# Patient Record
Sex: Female | Born: 1960 | Race: White | Hispanic: No | Smoking: Former smoker
Health system: Southern US, Community
[De-identification: ages and names within clinical notes are randomized; demographics above are authoritative.]

## PROBLEM LIST (undated history)

## (undated) DIAGNOSIS — F32A Depression, unspecified: Secondary | ICD-10-CM

## (undated) DIAGNOSIS — E119 Type 2 diabetes mellitus without complications: Secondary | ICD-10-CM

## (undated) DIAGNOSIS — I10 Essential (primary) hypertension: Secondary | ICD-10-CM

## (undated) DIAGNOSIS — J45909 Unspecified asthma, uncomplicated: Secondary | ICD-10-CM

## (undated) DIAGNOSIS — F329 Major depressive disorder, single episode, unspecified: Secondary | ICD-10-CM

## (undated) DIAGNOSIS — M797 Fibromyalgia: Secondary | ICD-10-CM

## (undated) DIAGNOSIS — M549 Dorsalgia, unspecified: Secondary | ICD-10-CM

## (undated) HISTORY — PX: STOMACH SURGERY: SHX791

---

## 2018-10-16 ENCOUNTER — Other Ambulatory Visit: Payer: Self-pay

## 2018-10-16 ENCOUNTER — Encounter (HOSPITAL_COMMUNITY): Payer: Self-pay

## 2018-10-16 ENCOUNTER — Emergency Department (HOSPITAL_COMMUNITY): Payer: 59

## 2018-10-16 ENCOUNTER — Emergency Department (HOSPITAL_COMMUNITY)
Admission: EM | Admit: 2018-10-16 | Discharge: 2018-10-16 | Disposition: A | Discharge status: Home / Self Care | Payer: 59 | Attending: Emergency Medicine | Admitting: Emergency Medicine

## 2018-10-16 DIAGNOSIS — R2242 Localized swelling, mass and lump, left lower limb: Secondary | ICD-10-CM | POA: Diagnosis not present

## 2018-10-16 DIAGNOSIS — I10 Essential (primary) hypertension: Secondary | ICD-10-CM | POA: Insufficient documentation

## 2018-10-16 DIAGNOSIS — E119 Type 2 diabetes mellitus without complications: Secondary | ICD-10-CM | POA: Insufficient documentation

## 2018-10-16 DIAGNOSIS — W19XXXA Unspecified fall, initial encounter: Secondary | ICD-10-CM | POA: Insufficient documentation

## 2018-10-16 DIAGNOSIS — Z87891 Personal history of nicotine dependence: Secondary | ICD-10-CM | POA: Insufficient documentation

## 2018-10-16 DIAGNOSIS — Y92019 Unspecified place in single-family (private) house as the place of occurrence of the external cause: Secondary | ICD-10-CM | POA: Diagnosis not present

## 2018-10-16 DIAGNOSIS — J45909 Unspecified asthma, uncomplicated: Secondary | ICD-10-CM | POA: Insufficient documentation

## 2018-10-16 DIAGNOSIS — Y998 Other external cause status: Secondary | ICD-10-CM | POA: Insufficient documentation

## 2018-10-16 DIAGNOSIS — M79672 Pain in left foot: Secondary | ICD-10-CM | POA: Insufficient documentation

## 2018-10-16 DIAGNOSIS — Y9389 Activity, other specified: Secondary | ICD-10-CM | POA: Diagnosis not present

## 2018-10-16 HISTORY — DX: Depression, unspecified: F32.A

## 2018-10-16 HISTORY — DX: Essential (primary) hypertension: I10

## 2018-10-16 HISTORY — DX: Major depressive disorder, single episode, unspecified: F32.9

## 2018-10-16 HISTORY — DX: Unspecified asthma, uncomplicated: J45.909

## 2018-10-16 HISTORY — DX: Fibromyalgia: M79.7

## 2018-10-16 HISTORY — DX: Type 2 diabetes mellitus without complications: E11.9

## 2018-10-16 HISTORY — DX: Dorsalgia, unspecified: M54.9

## 2018-10-16 NOTE — ED Provider Notes (Signed)
[facility]-EMERGENCY DEPT Provider Note   CSN: [id number] Arrival date & time: 10/16/18  1243    History   Chief Complaint Chief Complaint  Patient presents with  . Foot Pain  . Foot Swelling    HPI Yvonne Reeves is a 58 y.o. female.     Patient is a 58 year old female with past medical history of asthma, diabetes, hypertension, fibromyalgia who presents emergency department for left foot pain.  Patient reports that this past Saturday she was walking on a flat surface when "my foot gave out".  Reports that she has had pain, swelling and difficulty dorsiflexing her foot since then.  Reports that she has fallen several times since Saturday.  Reports that the same thing happened in the past and she was told that she damaged a nerve in her foot.  Reports that this healed after several weeks.  Reports that she has been trying to use a cane at home but is still having difficulty walking.  Denies hitting head or passing out, denies other injuries.  Has not taken any medication for relief.     Past Medical History:  Diagnosis Date  . Asthma   . Back pain   . Depression   . Diabetes mellitus without complication (HCC)   . Fibromyalgia   . Hypertension     There are no active problems to display for this patient.   Past Surgical History:  Procedure Laterality Date  . STOMACH SURGERY       OB History   No obstetric history on file.      Home Medications    Prior to Admission medications   Not on File    Family History History reviewed. No pertinent family history.  Social History Social History   Tobacco Use  . Smoking status: Former Games developer  . Smokeless tobacco: Never Used  Substance Use Topics  . Alcohol use: Not Currently  . Drug use: Never     Allergies   Patient has no allergy information on record.   Review of Systems Review of Systems  Constitutional: Negative for chills and fever.  HENT: Negative for ear pain and sore  throat.   Eyes: Negative for pain and visual disturbance.  Respiratory: Negative for cough and shortness of breath.   Cardiovascular: Negative for chest pain and palpitations.  Gastrointestinal: Negative for abdominal pain and vomiting.  Genitourinary: Negative for dysuria and hematuria.  Musculoskeletal: Positive for arthralgias, gait problem and joint swelling. Negative for back pain, myalgias, neck pain and neck stiffness.  Skin: Negative for color change and rash.  Neurological: Negative for seizures and syncope.  All other systems reviewed and are negative.    Physical Exam Updated Vital Signs BP 107/87   Pulse 97   Temp 98.2 F (36.8 C) (Oral)   Resp 16   Ht 5\' 4"  (1.626 m)   Wt 78.9 kg   SpO2 97%   BMI 29.87 kg/m   Physical Exam Vitals signs and nursing note reviewed.  Constitutional:      Appearance: Normal appearance.  HENT:     Head: Normocephalic.  Eyes:     Conjunctiva/sclera: Conjunctivae normal.  Pulmonary:     Effort: Pulmonary effort is normal.  Musculoskeletal:     Left ankle: She exhibits swelling and ecchymosis. Tenderness. Lateral malleolus and head of 5th metatarsal tenderness found. No AITFL tenderness found.     Comments: Significant swelling and ecchymosis to the lateral foot and ankle.  Normal distal pulses and  sensation. There is normal plantar flexion but patient has difficulty with dorsi flexion  Skin:    General: Skin is dry.  Neurological:     Mental Status: She is alert.  Psychiatric:        Mood and Affect: Mood normal.      ED Treatments / Results  Labs (all labs ordered are listed, but only abnormal results are displayed) Labs Reviewed - No data to display  EKG None  Radiology No results found.  Procedures Procedures (including critical care time)  Medications Ordered in ED Medications - No data to display   Initial Impression / Assessment and Plan / ED Course  I have reviewed the triage vital signs and the nursing  notes.  Pertinent labs & imaging results that were available during my care of the patient were reviewed by me and considered in my medical decision making (see chart for details).  Clinical Course as of Oct 17 1609  Tue Oct 16, 2018  1401 Xray showing possible avulsion fracture at the medial malleolus.  Patient does not have tenderness or swelling here or ecchymosis.  I think this is unlikely.  However, due to her significant swelling to the lateral malleolus and difficulty with dorsiflexion I am going to put her in a cam boot with crutches and have her follow-up with orthopedics.   [KM]    Clinical Course User Index [KM] Arlyn Dunning, PA-C      Based on review of vitals, medical screening exam, lab work and/or imaging, there does not appear to be an acute, emergent etiology for the patient's symptoms. Counseled pt on good return precautions and encouraged both PCP and ED follow-up as needed.  Prior to discharge, I also discussed incidental imaging findings with patient in detail and advised appropriate, recommended follow-up in detail.  Clinical Impression: 1. Foot pain, left   2. Accidental fall, initial encounter     Disposition: Discharge  Prior to providing a prescription for a controlled substance, I independently reviewed the patient's recent prescription history on the [place] Controlled Substance Reporting System. The patient had no recent or regular prescriptions and was deemed appropriate for a brief, less than 3 day prescription of narcotic for acute analgesia.  This note was prepared with assistance of Conservation officer, historic buildings. Occasional wrong-word or sound-a-like substitutions may have occurred due to the inherent limitations of voice recognition software.   Final Clinical Impressions(s) / ED Diagnoses   Final diagnoses:  Foot pain, left  Accidental fall, initial encounter    ED Discharge Orders    None       Jeral Pinch 10/18/18  1611    Linwood Dibbles, MD 10/22/18 [phone]

## 2018-10-16 NOTE — Discharge Instructions (Addendum)
Thank you for allowing me to care for you today. Please return to the emergency department if you have new or worsening symptoms. Take your medications as instructed.   Take motrin or tylenol for pain.

## 2018-10-16 NOTE — ED Triage Notes (Signed)
Pt endorse left foot pain and swelling. HX of the same 20 years ago. Reports she tore a nerve then and had to wear a brace.  Fell on Saturday. Pt endorse her left foot gave out. Pt  Has Good CMS.

## 2020-04-18 IMAGING — CR LEFT ANKLE COMPLETE - 3+ VIEW
4 series · 4 of 4 positions shown · non-contrast
Comparison: None.

CLINICAL DATA: Left foot pain, swelling, fall

EXAM:
LEFT ANKLE COMPLETE - 3+ VIEW

[x ankle ap left]
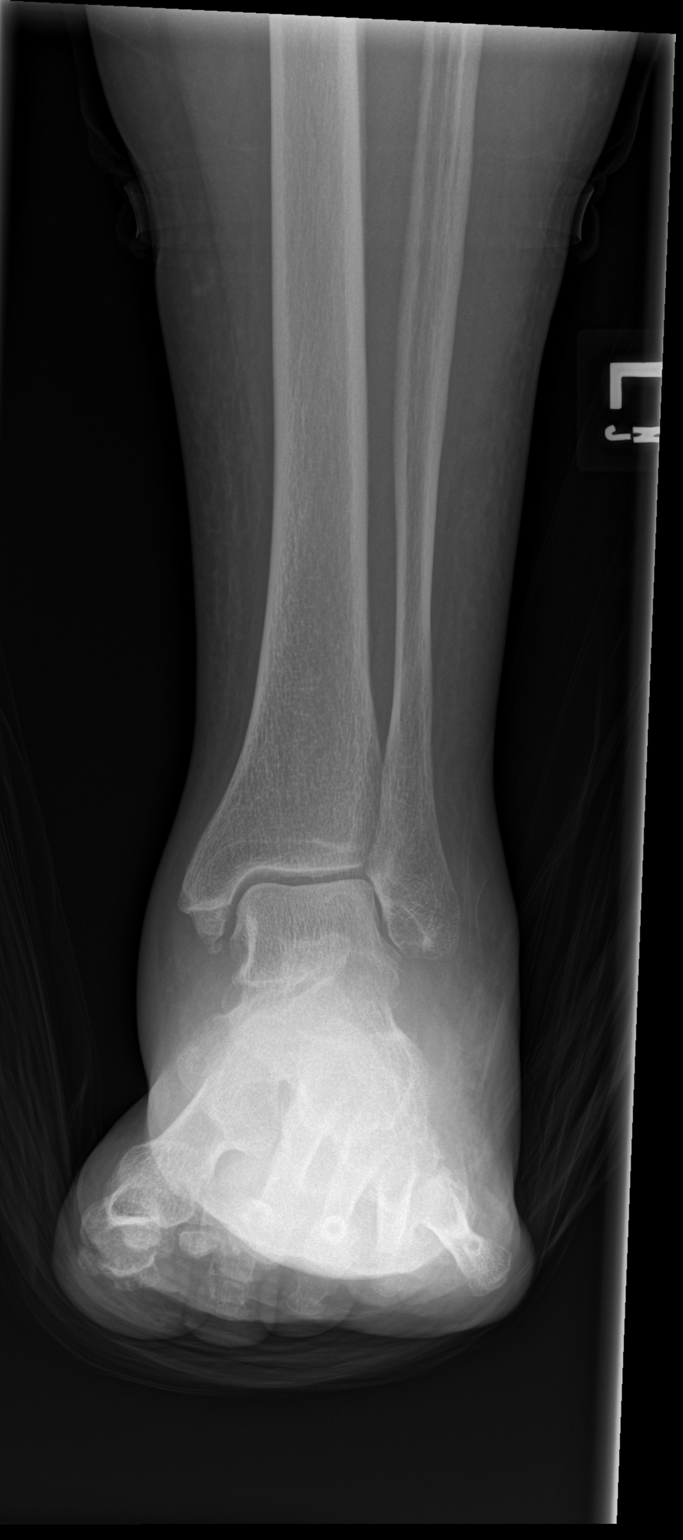

[x ankle obl left]
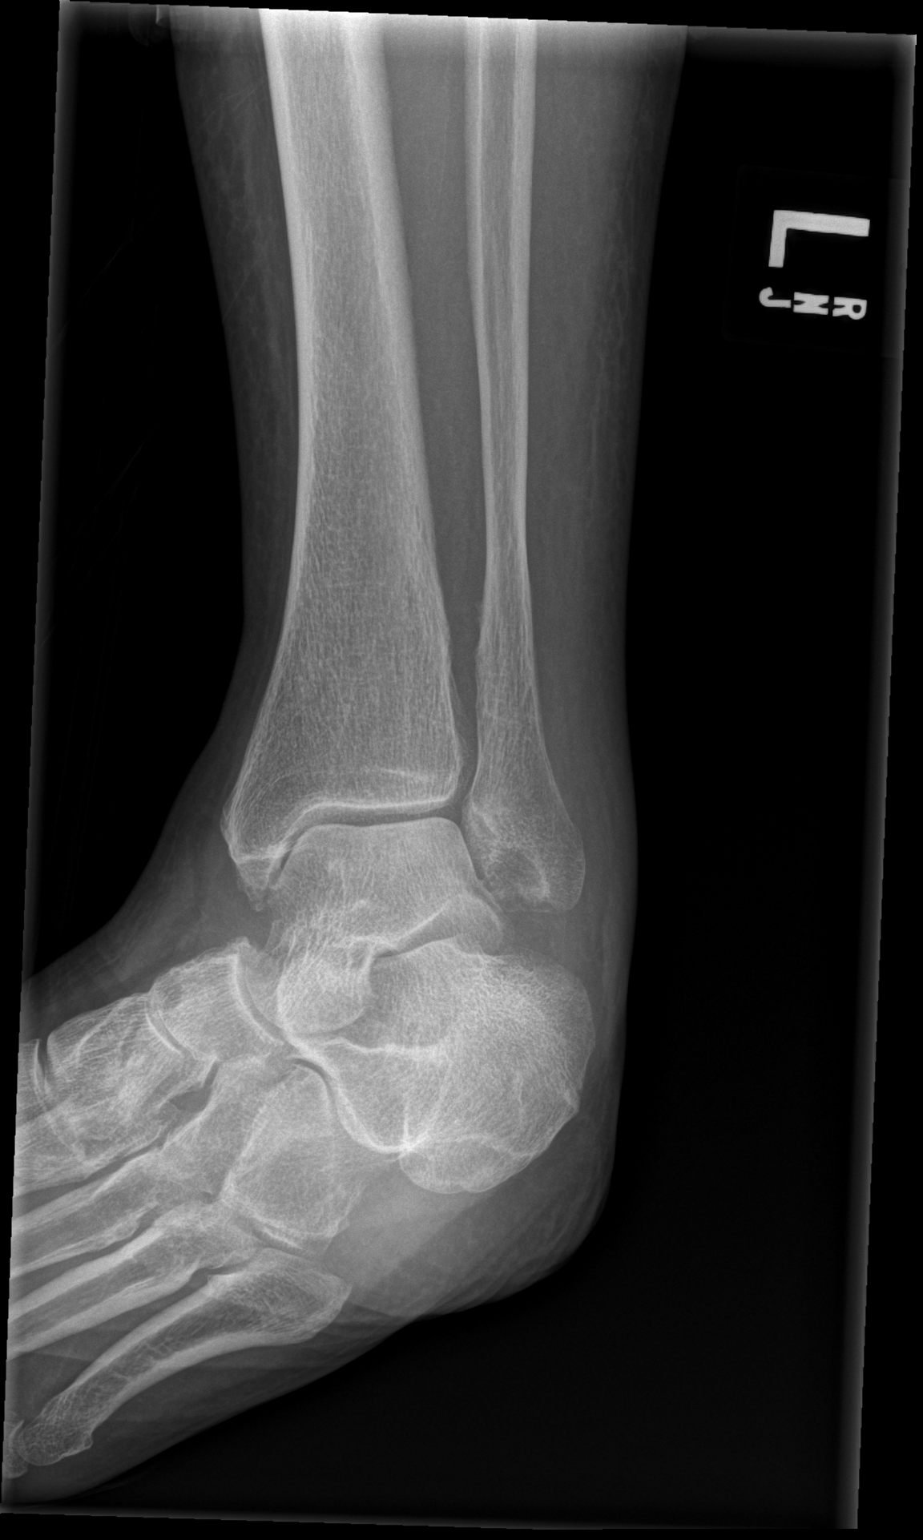

[x ankle lat left (1 of 2)]
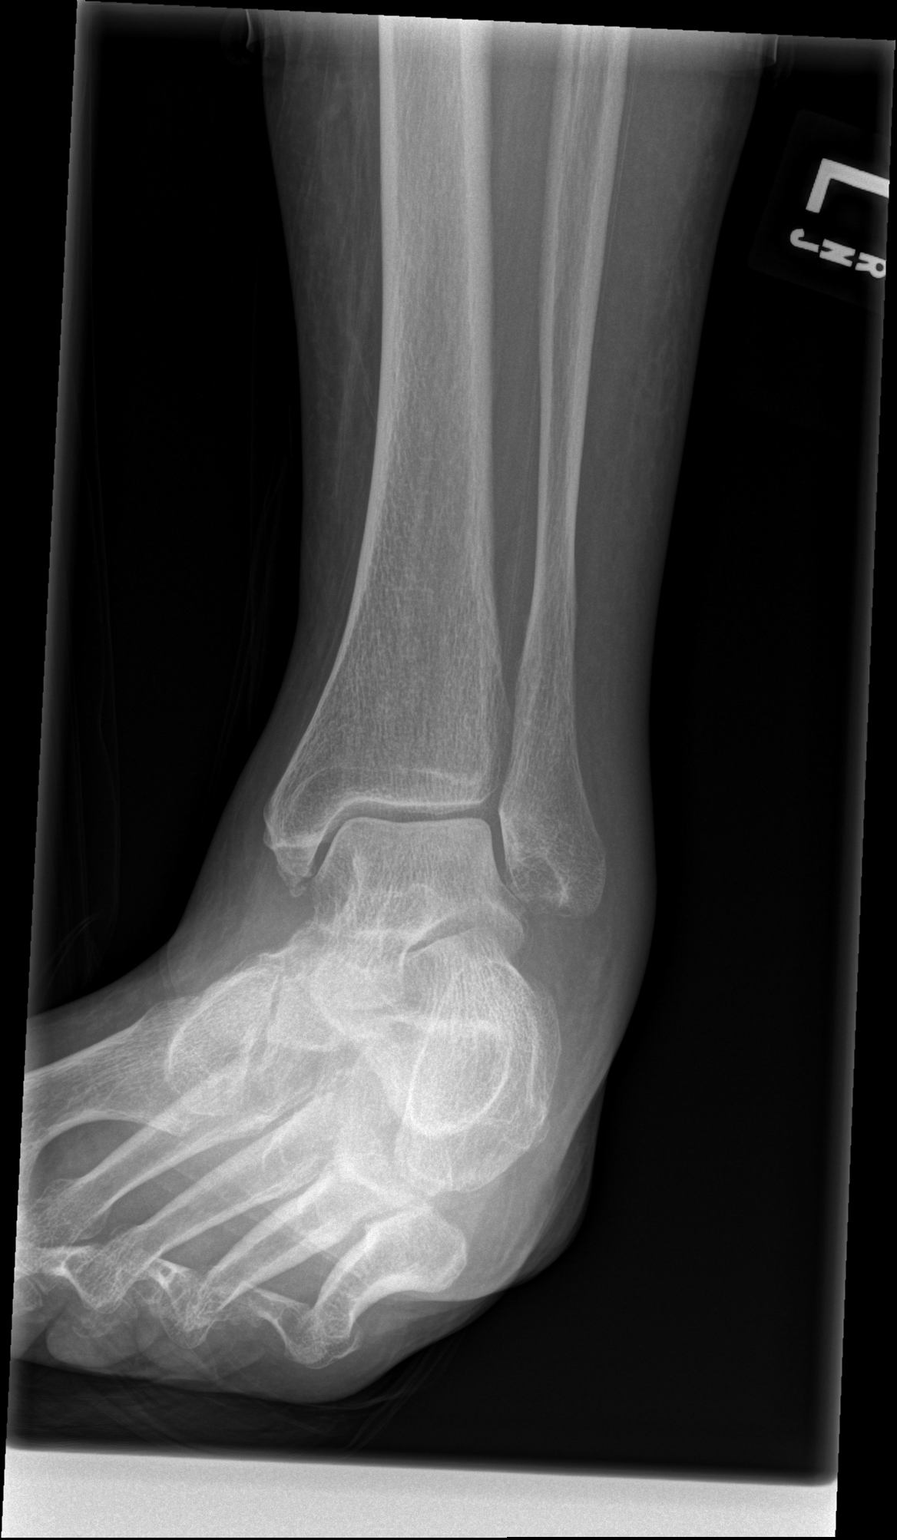

[x ankle lat left (2 of 2)]
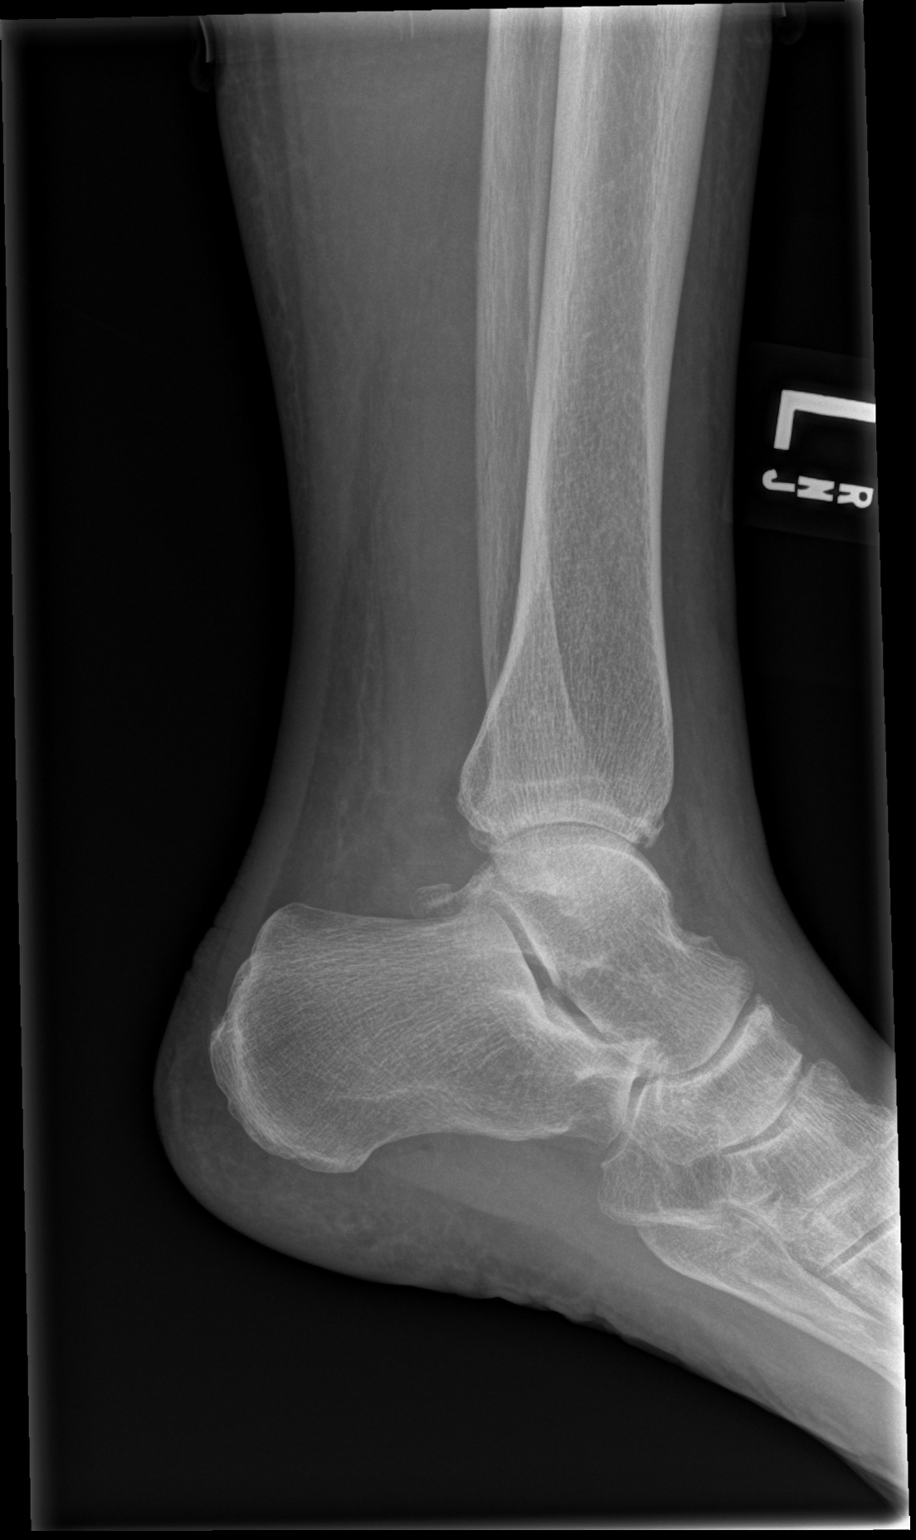

[4 of 4 positions shown; findings below may reference images not displayed]

FINDINGS: Diffuse soft tissue swelling, most pronounced laterally. Small bone
fragments are noted adjacent to the medial malleolus compatible with
avulsed fragments, age indeterminate. Joint spaces maintained.
IMPRESSION: Small avulsed fragment off the tip of the medial malleolus, age
indeterminate.

Diffuse soft tissue swelling.

## 2020-04-18 IMAGING — CR LEFT FOOT - COMPLETE 3+ VIEW
3 series · 3 of 3 positions shown · non-contrast
Comparison: Ankle series performed today

CLINICAL DATA: Fall, left foot and ankle pain.

EXAM:
LEFT FOOT - COMPLETE 3+ VIEW

[x foot ap left]
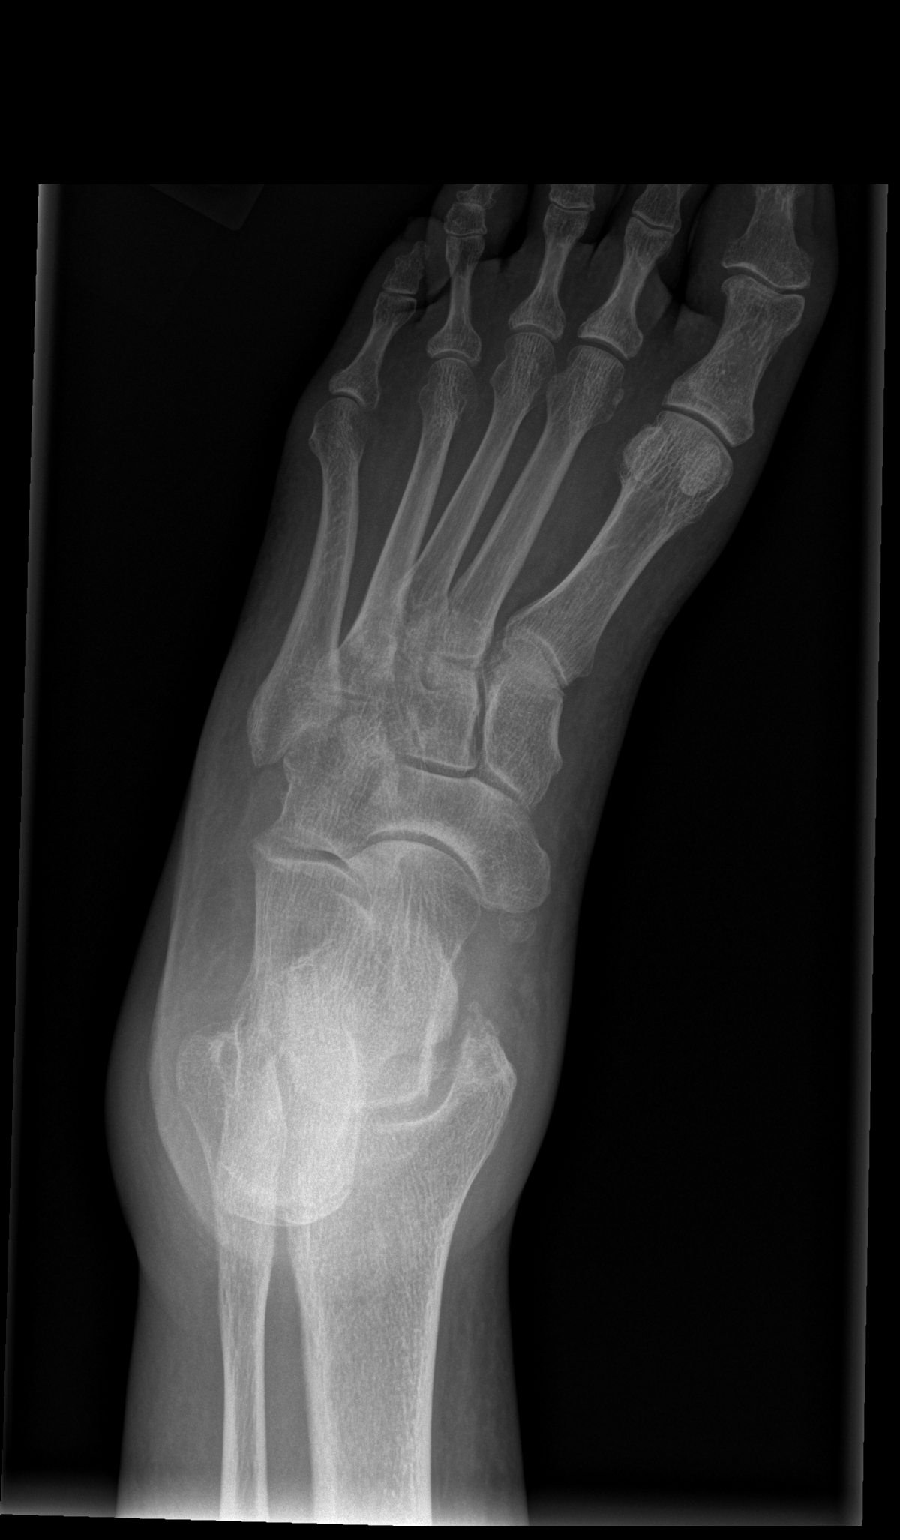

[x foot obl left]
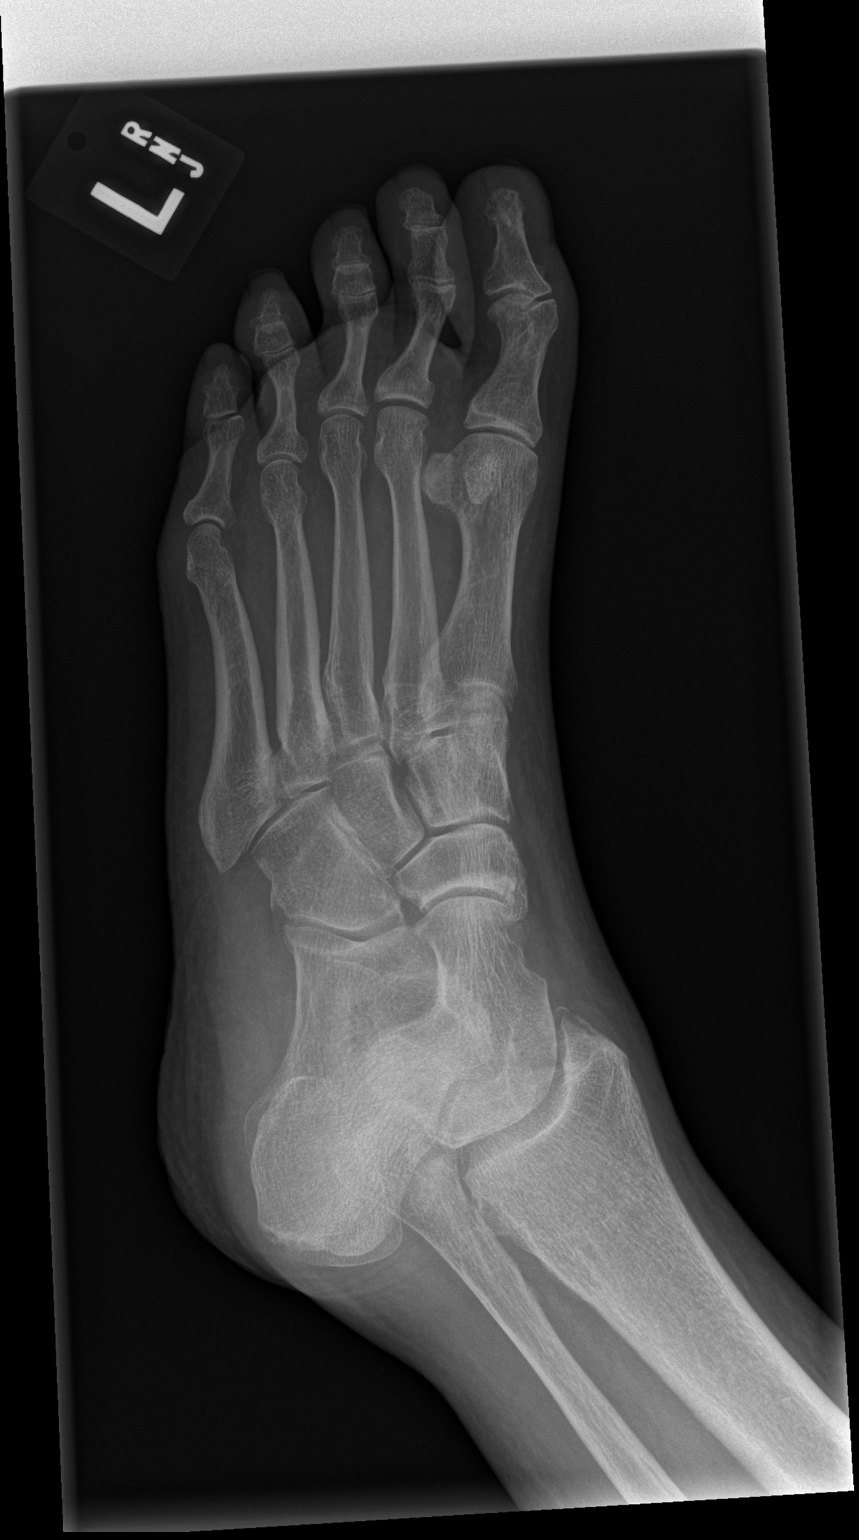

[x foot lat left]
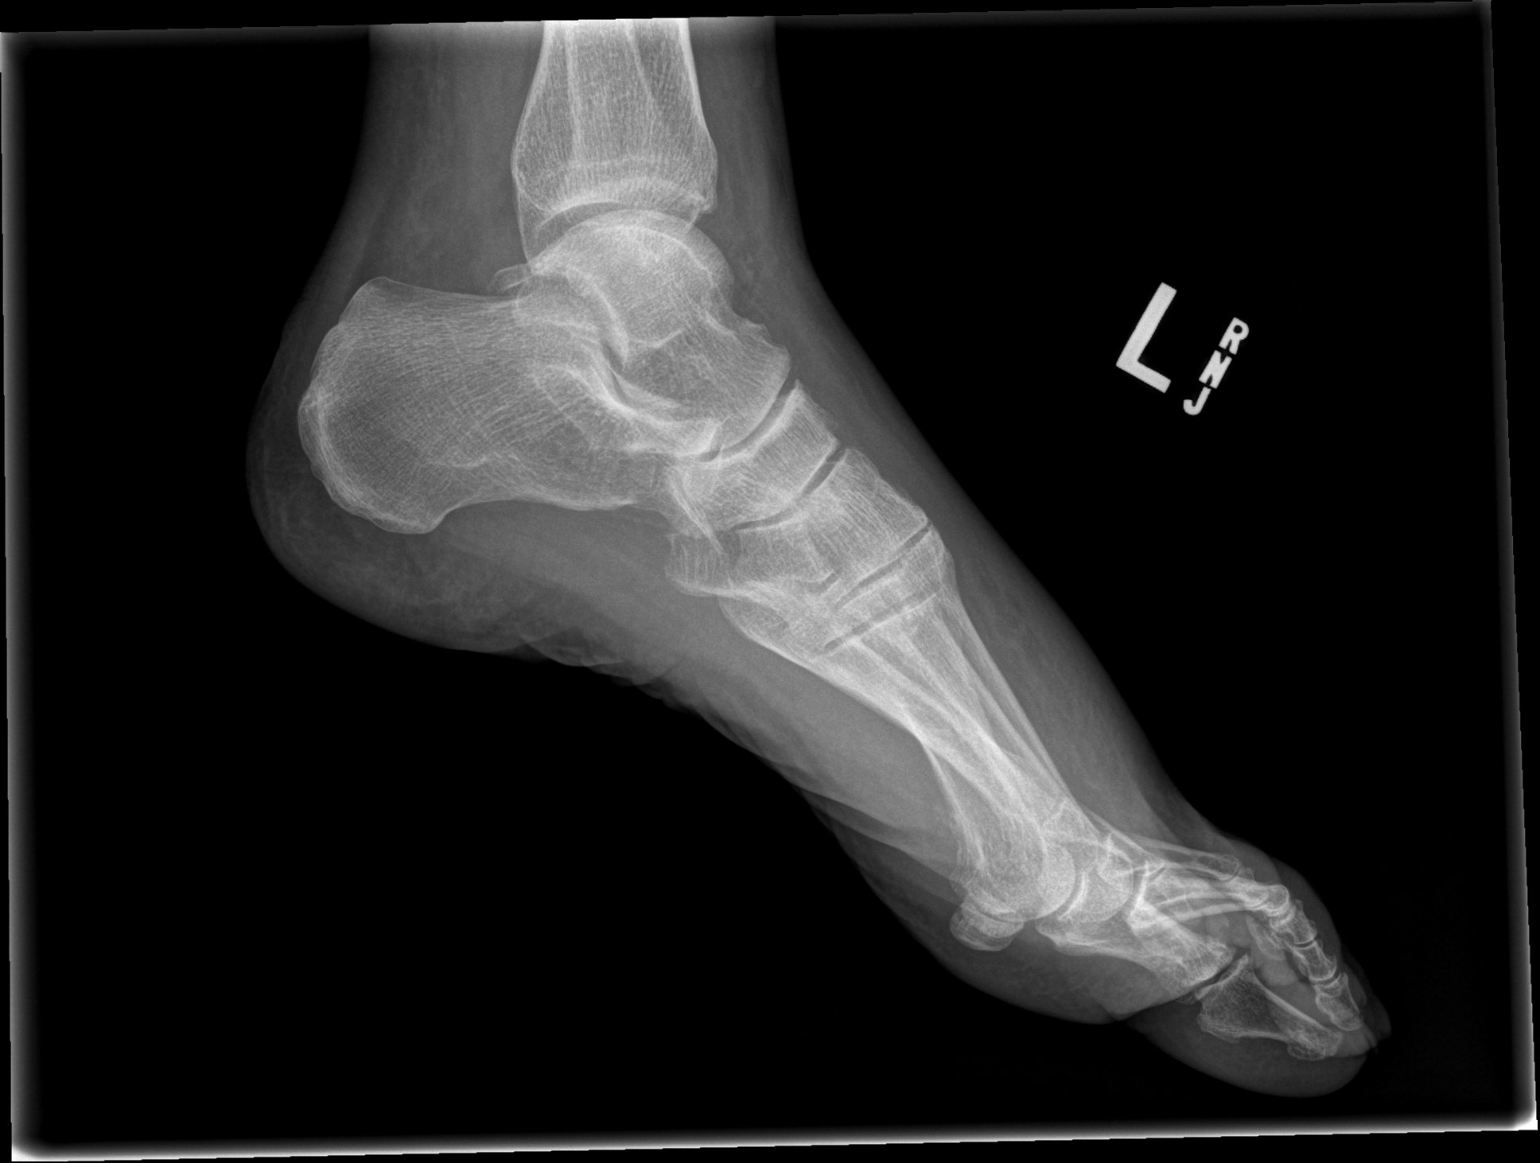

[3 of 3 positions shown; findings below may reference images not displayed]

FINDINGS: Small bone fragment noted adjacent to the medial malleolus
compatible with small avulsed fragment, age indeterminate. No
additional fracture visualized. No subluxation or dislocation. Joint
spaces maintained.
IMPRESSION: Small avulsed fragment from the tip of the medial malleolus, age
indeterminate.
